# Patient Record
Sex: Male | Born: 1985 | Race: Black or African American | Hispanic: No | Marital: Single | Smoking: Current every day smoker
Health system: Southern US, Community
[De-identification: ages and names within clinical notes are randomized; demographics above are authoritative.]

## PROBLEM LIST (undated history)

## (undated) DIAGNOSIS — J45909 Unspecified asthma, uncomplicated: Secondary | ICD-10-CM

---

## 2007-04-20 ENCOUNTER — Emergency Department: Payer: Self-pay | Admitting: Emergency Medicine

## 2007-09-28 ENCOUNTER — Emergency Department: Payer: Self-pay | Admitting: Emergency Medicine

## 2009-04-19 ENCOUNTER — Emergency Department: Payer: Self-pay | Admitting: Emergency Medicine

## 2009-08-05 ENCOUNTER — Emergency Department: Payer: Self-pay | Admitting: Unknown Physician Specialty

## 2010-10-09 ENCOUNTER — Emergency Department: Payer: Self-pay | Admitting: Emergency Medicine

## 2013-05-10 ENCOUNTER — Emergency Department: Payer: Self-pay | Admitting: Emergency Medicine

## 2013-08-31 ENCOUNTER — Inpatient Hospital Stay: Payer: Self-pay | Admitting: Specialist

## 2013-08-31 LAB — CBC
HCT: 47.9 % (ref 40.0–52.0)
HGB: 15.9 g/dL (ref 13.0–18.0)
MCH: 29.7 pg (ref 26.0–34.0)
MCHC: 33.3 g/dL (ref 32.0–36.0)
MCV: 89 fL (ref 80–100)
PLATELETS: 307 10*3/uL (ref 150–440)
RBC: 5.38 10*6/uL (ref 4.40–5.90)
RDW: 13.5 % (ref 11.5–14.5)
WBC: 10.2 10*3/uL (ref 3.8–10.6)

## 2013-08-31 LAB — BASIC METABOLIC PANEL
ANION GAP: 5 — AB (ref 7–16)
BUN: 10 mg/dL (ref 7–18)
CHLORIDE: 103 mmol/L (ref 98–107)
Calcium, Total: 9.7 mg/dL (ref 8.5–10.1)
Co2: 32 mmol/L (ref 21–32)
Creatinine: 1.15 mg/dL (ref 0.60–1.30)
EGFR (African American): >60
EGFR (Non-African Amer.): >60
Glucose: 90 mg/dL (ref 65–99)
Osmolality: 278 (ref 275–301)
Potassium: 3.6 mmol/L (ref 3.5–5.1)
Sodium: 140 mmol/L (ref 136–145)

## 2013-08-31 LAB — RAPID INFLUENZA A&B ANTIGENS (ARMC ONLY)

## 2013-09-05 LAB — CULTURE, BLOOD (SINGLE)

## 2014-01-12 ENCOUNTER — Emergency Department: Payer: Self-pay | Admitting: Emergency Medicine

## 2014-01-16 ENCOUNTER — Emergency Department: Payer: Self-pay | Admitting: Emergency Medicine

## 2014-01-16 LAB — WOUND CULTURE

## 2014-12-18 NOTE — Discharge Summary (Signed)
PATIENT NAME:  Keith StallingATE, Keith Hood MR#:  [id number] DATE OF BIRTH:  18-Sep-1985  DATE OF ADMISSION:  08/31/2013 DATE OF DISCHARGE:  08/31/2013  For detailed note, please look at the history and physical done on admission by Dr. Randol KernElgergawy.   DIAGNOSES AT DISCHARGE: Are as follows:   1.  Asthma exacerbation.  2.  Shortness of breath due to asthma exacerbation.   The patient is being discharged on a regular diet.   ACTIVITIES:  As tolerated.   [facility]   DISCHARGE MEDICATIONS: Albuterol inhaler as needed, prednisone taper starting at 50 mg down to 10 mg over the next 5 days, Advair 250/50, 1 puff b.i.d.   PERTINENT STUDIES DONE DURING HOSPITAL COURSE:  Are as follows: A chest x-ray done on admission showing no acute cardiopulmonary disease. The patient's blood cultures are negative. The patient's influenza test was also negative.   BRIEF HOSPITAL COURSE: This is a 29 year old male who presented to the hospital with shortness of breath and wheezing and noted to be in asthma exacerbation.  1.  Asthma exacerbation. This was likely the cause of the patient's shortness of breath. The patient was admitted to the hospital as he did not improve being treated in the ER with some continuous nebulizer treatments and some oral prednisone. He was, therefore, admitted to the hospital, started on IV steroids, around-the-clock nebulizer treatments. After aggressive therapy, the patient has significantly improved. He has less wheezing. He is able to talk in full sentences. He was also ambulated and did not desaturate. He only takes albuterol as needed for his asthma. I started him on some Advair for some maintenance. Presently, he will be discharged on an oral prednisone taper along with Advair and rescue albuterol as needed. I explained to the patient that he needs to follow up with a primary care physician regularly, every 6 months to 1 year. As the patient is clinically  feeling well, he is being discharged home.    Time spent on discharge is 40 minutes.    ____________________________ Rolly PancakeVivek J. Cherlynn KaiserSainani, MD vjs:dmm D: 08/31/2013 16:07:37 ET T: 08/31/2013 19:04:36 ET JOB#: [id number]  cc: Rolly PancakeVivek J. Cherlynn KaiserSainani, MD, <Dictator> Houston SirenVIVEK J SAINANI MD ELECTRONICALLY SIGNED 09/23/2013 11:19

## 2014-12-18 NOTE — H&P (Signed)
PATIENT NAME:  Keith StallingATE, Per MR#:  [id number] DATE OF BIRTH:  1986/08/08  DATE OF ADMISSION:  08/31/2013  REFERRING PHYSICIAN: Dr. Chiquita LothJade Sung.   PRIMARY CARE PHYSICIAN: None.   CHIEF COMPLAINT: Shortness of breath, wheezing.   HISTORY OF PRESENT ILLNESS: This is a 29 year old male with significant past medical history of asthma. Never been intubated. Was only hospitalized once when he was a child. The patient presents with complaints of shortness of breath and wheezing. The patient reports his symptoms have started a couple of days ago when he started to have cough and upper respiratory symptoms. He reports he had a few asthma attacks yesterday x 3. He received his albuterol inhaler multiple times without much help, so he decided to come to the ED. In the ED, the patient received p.o. prednisone x 1, received nebulizer treatment x 3. As well, he received IV Solu-Medrol. Despite all of that, he remained with shortness of breath, having significant wheezing. His chest x-ray did not show any acute opacity or infiltrate. Hospitalist service was requested to admit the patient for further treatment of his asthma attack. The patient denies any fever, any chills. He was afebrile. His labs were essentially within normal limits.   PAST MEDICAL HISTORY: Asthma.   PAST SURGICAL HISTORY: None.   ALLERGIES: SHELLFISH.   HOME MEDICATION: Albuterol p.r.n.   FAMILY HISTORY: Significant for asthma in grandmother, mother and brother.   SOCIAL HISTORY: The patient smokes 4 cigarettes per day. No alcohol. No illicit drug use.   REVIEW OF SYSTEMS:  CONSTITUTIONAL: The patient denies any fever, chills, fatigue, weakness, weight gain, weight loss.  EYES: Denies blurry vision, double vision, inflammation, glaucoma.  ENT: Denies tinnitus, ear pain, hearing loss, epistaxis or nasal discharge.  RESPIRATORY: Denies any hemoptysis or any COPD. Complains of cough, wheezing, dyspnea.  CARDIOVASCULAR: Denies chest pain,  orthopnea, edema, arrhythmia or palpitation.  GASTROINTESTINAL: Denies any nausea, diarrhea, abdominal pain, hematemesis currently. Had an episode of vomiting x 1. Nothing since.   GENITOURINARY: Denies dysuria, hematuria, renal colic.  ENDOCRINE: Denies polyuria, polydipsia, heat or cold intolerance.  HEMATOLOGY: Denies anemia, easy bruising, bleeding diathesis.  INTEGUMENT: Denies acne, rash or skin lesions.  MUSCULOSKELETAL: Denies any swelling, gout or arthritis.  NEUROLOGIC: Denies CVA, TIA, dementia, headache.  PSYCHIATRIC: Denies anxiety, insomnia, bipolar disorder.   PHYSICAL EXAMINATION:  VITAL SIGNS: Temperature 98, pulse 85, respiratory rate 22, blood pressure 132/84, saturating 98% on room air.  GENERAL: Well-nourished male, looks comfortable in bed, in no apparent distress.  HEENT: Head atraumatic, normocephalic. Pupils equal, reactive to light. Pink conjunctivae. Anicteric sclerae. Moist oral mucosa.  NECK: Supple. No thyromegaly. No JVD.  CHEST: Had fair air entry bilaterally with diffuse wheezing. No rales or rhonchi.  CARDIOVASCULAR: S1 and S2 heard. No rubs, murmurs or gallops.  ABDOMEN: Soft, nontender, nondistended. Bowel sounds present.  EXTREMITIES: No edema. No clubbing. No cyanosis.  PSYCHIATRIC: Appropriate affect. Awake, alert x 3. Intact judgment and insight.  NEUROLOGIC: Cranial nerves grossly intact. Motor 5 out of 5. No focal deficits.  SKIN: Warm and dry. Normal skin turgor.  LYMPHATICS: No cervical lymphadenopathy.   PERTINENT LABORATORIES: Glucose is 90, BUN 10, creatinine 1.15, sodium 140, potassium 3.6, chloride 103, CO2 32. White blood cells 10.2, hemoglobin 15.9, hematocrit 47.9, platelets 307. Influenza  negative.    Chest x-ray showing no active cardiopulmonary disease.   ASSESSMENT AND PLAN:  1. Acute asthma attack/exacerbation: The patient still having significant wheezing and shortness of breath despite receiving multiple  nebulizers and  intravenous steroids, so he will be admitted. He will be kept on oxygen p.r.n. He will be on intravenous Solu-Medrol 125 every 8 hours. Will be on DuoNebs every 4 hours and p.r.n. albuterol. The patient is afebrile. Has no opacity or infiltrate, so there is no indication for intravenous antibiotics.  2. Tobacco abuse: The patient was counseled.  3. Deep vein thrombosis prophylaxis: Subcutaneous heparin.  4. Gastrointestinal prophylaxis: Will start the patient on proton pump inhibitor, especially he is on large dose of steroids.   CODE STATUS: FULL CODE.   TOTAL TIME SPENT ON ADMISSION AND PATIENT CARE: 45 minutes.   ____________________________ Starleen Arms, MD dse:gb D: 08/31/2013 02:45:57 ET T: 08/31/2013 04:12:14 ET JOB#: [id number]  cc: Starleen Arms, MD, <Dictator> Keith Hood Teena Irani MD ELECTRONICALLY SIGNED 09/01/2013 0:02

## 2015-04-30 ENCOUNTER — Emergency Department
Admission: EM | Admit: 2015-04-30 | Discharge: 2015-04-30 | Disposition: A | Discharge status: Home / Self Care | Payer: Self-pay | Attending: Emergency Medicine | Admitting: Emergency Medicine

## 2015-04-30 ENCOUNTER — Encounter: Payer: Self-pay | Admitting: Emergency Medicine

## 2015-04-30 DIAGNOSIS — Z720 Tobacco use: Secondary | ICD-10-CM | POA: Insufficient documentation

## 2015-04-30 DIAGNOSIS — H0011 Chalazion right upper eyelid: Secondary | ICD-10-CM | POA: Insufficient documentation

## 2015-04-30 HISTORY — DX: Unspecified asthma, uncomplicated: J45.909

## 2015-04-30 MED ORDER — TETRACAINE HCL 0.5 % OP SOLN
OPHTHALMIC | Status: AC
  Administered 2015-04-30: 10:00:00
  Filled 2015-04-30: qty 2

## 2015-04-30 MED ORDER — OLOPATADINE HCL 0.1 % OP SOLN: 1.0000 [drp] | Freq: Two times a day (BID) | OPHTHALMIC | Status: AC

## 2015-04-30 MED ORDER — HYDROXYZINE HCL 50 MG PO TABS
50.0000 mg | ORAL_TABLET | Freq: Once | ORAL | Status: AC
  Administered 2015-04-30: 50 mg via ORAL
  Filled 2015-04-30: qty 1

## 2015-04-30 MED ORDER — HYDROXYZINE HCL 50 MG PO TABS: 50.0000 mg | ORAL_TABLET | Freq: Three times a day (TID) | ORAL | Status: AC | PRN

## 2015-04-30 NOTE — ED Notes (Signed)
Reports having a "sty" and has been doing hot compresses but not improving.

## 2015-04-30 NOTE — ED Provider Notes (Signed)
[facility] Emergency Department Provider Note  ____________________________________________  Time seen: Approximately 10:14 AM  I have reviewed the triage vital signs and the nursing notes.   HISTORY  Chief Complaint Eye Pain    HPI Keith Hood is a 29 y.o. male patient complain having a swollen upper eyelid for 2 weeks. Patient thought he had a stye and applied warm compresses. Condition improved last week but increased swelling in the last 2 days.Patient stated eyelid swelling is affecting his vision. Patient is rating his pain discomfort as a 5/10.   Past Medical History  Diagnosis Date  . Asthma     There are no active problems to display for this patient.   History reviewed. No pertinent past surgical history.  Current Outpatient Rx  Name  Route  Sig  Dispense  Refill  . hydrOXYzine (ATARAX/VISTARIL) 50 MG tablet   Oral   Take 1 tablet (50 mg total) by mouth 3 (three) times daily as needed.   15 tablet   0   . olopatadine (PATANOL) 0.1 % ophthalmic solution   Left Eye   Place 1 drop into the left eye 2 (two) times daily.   5 mL   1     Allergies Shellfish allergy  History reviewed. No pertinent family history.  Social History Social History  Substance Use Topics  . Smoking status: Current Every Day Smoker  . Smokeless tobacco: None  . Alcohol Use: No    Review of Systems Constitutional: No fever/chills Eyes: No visual changes. Right upper eyelid edema ENT: No sore throat. Cardiovascular: Denies chest pain. Respiratory: Denies shortness of breath. Gastrointestinal: No abdominal pain.  No nausea, no vomiting.  No diarrhea.  No constipation. Genitourinary: Negative for dysuria. Musculoskeletal: Negative for back pain. Skin: Negative for rash. Neurological: Negative for headaches, focal weakness or numbness. 10-point ROS otherwise negative.  ____________________________________________   PHYSICAL EXAM:  VITAL  SIGNS: ED Triage Vitals  Enc Vitals Group     BP 04/30/15 0945 135/85 mmHg     Pulse Rate 04/30/15 0945 68     Resp 04/30/15 0945 16     Temp 04/30/15 0945 99 F (37.2 C)     Temp Source 04/30/15 0945 Oral     SpO2 04/30/15 0945 100 %     Weight 04/30/15 0945 185 lb (83.915 kg)     Height 04/30/15 0945 5\' 11"  (1.803 m)     Head Cir --      Peak Flow --      Pain Score 04/30/15 0946 5     Pain Loc --      Pain Edu? --      Excl. in GC? --     Constitutional: Alert and oriented. Well appearing and in no acute distress. Eyes: Conjunctivae are normal. PERRL. EOMI. upper eyelid edema with whitish yellowish lesion in the upper inner eyelid. Head: Atraumatic. Nose: No congestion/rhinnorhea. Mouth/Throat: Mucous membranes are moist.  Oropharynx non-erythematous. Neck: No stridor.  No cervical spine tenderness to palpation. Hematological/Lymphatic/Immunilogical: No cervical lymphadenopathy. Cardiovascular: Normal rate, regular rhythm. Grossly normal heart sounds.  Good peripheral circulation. Respiratory: Normal respiratory effort.  No retractions. Lungs CTAB. Gastrointestinal: Soft and nontender. No distention. No abdominal bruits. No CVA tenderness.  Musculoskeletal: No lower extremity tenderness nor edema.  No joint effusions. Neurologic:  Normal speech and language. No gross focal neurologic deficits are appreciated. No gait instability. Skin:  Skin is warm, dry and intact. No rash noted. Psychiatric: Mood and affect  are normal. Speech and behavior are normal.  ____________________________________________   LABS (all labs ordered are listed, but only abnormal results are displayed)  Labs Reviewed - No data to display ____________________________________________  EKG   ____________________________________________  RADIOLOGY   ____________________________________________   PROCEDURES  Procedure(s) performed: None  Critical Care performed:  No  ____________________________________________   INITIAL IMPRESSION / ASSESSMENT AND PLAN / ED COURSE  Pertinent labs & imaging results that were available during my care of the patient were reviewed by me and considered in my medical decision making (see chart for details). Chalazion right upper eye lid. Advised patient due to condition persistent over 2 weeks advised follow up with [facility] in 3 days. Continue supportive care warm compresses as directed. ____________________________________________   FINAL CLINICAL IMPRESSION(S) / ED DIAGNOSES  Final diagnoses:  Chalazion of right upper eyelid      Joni Reining, PA-C 04/30/15 1025  Jennye Moccasin, MD 04/30/15 [phone]

## 2015-04-30 NOTE — Discharge Instructions (Signed)
Chalazion A chalazion is a swelling or hard lump on the eyelid caused by a blocked oil gland. Chalazions may occur on the upper or the lower eyelid.  CAUSES  Oil gland in the eyelid becomes blocked. SYMPTOMS   Swelling or hard lump on the eyelid. This lump may make it hard to see out of the eye.  The swelling may spread to areas around the eye. TREATMENT   Although some chalazions disappear by themselves in 1 or 2 months, some chalazions may need to be removed.  Medicines to treat an infection may be required. HOME CARE INSTRUCTIONS   Wash your hands often and dry them with a clean towel. Do not touch the chalazion.  Apply heat to the eyelid several times a day for 10 minutes to help ease discomfort and bring any yellowish white fluid (pus) to the surface. One way to apply heat to a chalazion is to use the handle of a metal spoon.  Hold the handle under hot water until it is hot, and then wrap the handle in paper towels so that the heat can come through without burning your skin.  Hold the wrapped handle against the chalazion and reheat the spoon handle as needed.  Apply heat in this fashion for 10 minutes, 4 times per day.  Return to your caregiver to have the pus removed if it does not break (rupture) on its own.  Do not try to remove the pus yourself by squeezing the chalazion or sticking it with a pin or needle.  Only take over-the-counter or prescription medicines for pain, discomfort, or fever as directed by your caregiver. SEEK IMMEDIATE MEDICAL CARE IF:   You have pain in your eye.  Your vision changes.  The chalazion does not go away.  The chalazion becomes painful, red, or swollen, grows larger, or does not start to disappear after 2 weeks. MAKE SURE YOU:   Understand these instructions.  Will watch your condition.  Will get help right away if you are not doing well or get worse. Document Released: 08/10/2000 Document Revised: 11/05/2011 Document Reviewed:  11/28/2009 ExitCare Patient Information 2015 [employer]. This information is not intended to replace advice given to you by your health care provider. Make sure you discuss any questions you have with your health care provider.  

## 2016-02-29 ENCOUNTER — Emergency Department: Payer: Self-pay

## 2016-02-29 ENCOUNTER — Emergency Department
Admission: EM | Admit: 2016-02-29 | Discharge: 2016-02-29 | Disposition: A | Discharge status: Home / Self Care | Payer: Self-pay | Attending: Emergency Medicine | Admitting: Emergency Medicine

## 2016-02-29 DIAGNOSIS — F17210 Nicotine dependence, cigarettes, uncomplicated: Secondary | ICD-10-CM | POA: Insufficient documentation

## 2016-02-29 DIAGNOSIS — J45909 Unspecified asthma, uncomplicated: Secondary | ICD-10-CM | POA: Insufficient documentation

## 2016-02-29 DIAGNOSIS — Y929 Unspecified place or not applicable: Secondary | ICD-10-CM | POA: Insufficient documentation

## 2016-02-29 DIAGNOSIS — Y999 Unspecified external cause status: Secondary | ICD-10-CM | POA: Insufficient documentation

## 2016-02-29 DIAGNOSIS — S40012A Contusion of left shoulder, initial encounter: Secondary | ICD-10-CM | POA: Insufficient documentation

## 2016-02-29 DIAGNOSIS — R52 Pain, unspecified: Secondary | ICD-10-CM

## 2016-02-29 DIAGNOSIS — Y93K1 Activity, walking an animal: Secondary | ICD-10-CM | POA: Insufficient documentation

## 2016-02-29 DIAGNOSIS — W228XXA Striking against or struck by other objects, initial encounter: Secondary | ICD-10-CM | POA: Insufficient documentation

## 2016-02-29 MED ORDER — NAPROXEN 500 MG PO TABS
500.0000 mg | ORAL_TABLET | Freq: Once | ORAL | Status: AC
  Administered 2016-02-29: 500 mg via ORAL
  Filled 2016-02-29: qty 1

## 2016-02-29 MED ORDER — NAPROXEN 500 MG PO TABS: 500.0000 mg | ORAL_TABLET | Freq: Two times a day (BID) | ORAL | Status: AC

## 2016-02-29 NOTE — ED Notes (Signed)
Pt c/o left shoulder pain since sleeping on a hard wood floor and then pulled when walking a dog yesterday
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
## 2016-02-29 NOTE — Discharge Instructions (Signed)
Wear arm sling for 2. Contusion A contusion is a deep bruise. Contusions happen when an injury causes bleeding under the skin. Symptoms of bruising include pain, swelling, and discolored skin. The skin may turn blue, purple, or yellow. HOME CARE   Rest the injured area.  If told, put ice on the injured area.  Put ice in a plastic bag.  Place a towel between your skin and the bag.  Leave the ice on for 20 minutes, 2-3 times per day.  If told, put light pressure (compression) on the injured area using an elastic bandage. Make sure the bandage is not too tight. Remove it and put it back on as told by your doctor.  If possible, raise (elevate) the injured area above the level of your heart while you are sitting or lying down.  Take over-the-counter and prescription medicines only as told by your doctor. GET HELP IF:  Your symptoms do not get better after several days of treatment.  Your symptoms get worse.  You have trouble moving the injured area. GET HELP RIGHT AWAY IF:   You have very bad pain.  You have a loss of feeling (numbness) in a hand or foot.  Your hand or foot turns pale or cold.   This information is not intended to replace advice given to you by your health care provider. Make sure you discuss any questions you have with your health care provider.   Document Released: 01/30/2008 Document Revised: 05/04/2015 Document Reviewed: 12/29/2014 Elsevier Interactive Patient Education [employer]2016 [employer].
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
